# Patient Record
Sex: Male | Born: 1985 | Race: White | Hispanic: No | Marital: Married | Smoking: Current every day smoker
Health system: Southern US, Community
[De-identification: ages and names within clinical notes are randomized; demographics above are authoritative.]

---

## 2017-10-19 ENCOUNTER — Emergency Department: Payer: Medicaid Other

## 2017-10-19 ENCOUNTER — Other Ambulatory Visit: Payer: Self-pay

## 2017-10-19 ENCOUNTER — Encounter: Payer: Self-pay | Admitting: Emergency Medicine

## 2017-10-19 ENCOUNTER — Emergency Department
Admission: EM | Admit: 2017-10-19 | Discharge: 2017-10-19 | Disposition: A | Discharge status: Home / Self Care | Payer: Medicaid Other | Attending: Emergency Medicine | Admitting: Emergency Medicine

## 2017-10-19 DIAGNOSIS — W501XXA Accidental kick by another person, initial encounter: Secondary | ICD-10-CM | POA: Insufficient documentation

## 2017-10-19 DIAGNOSIS — S2231XA Fracture of one rib, right side, initial encounter for closed fracture: Secondary | ICD-10-CM | POA: Diagnosis not present

## 2017-10-19 DIAGNOSIS — Y929 Unspecified place or not applicable: Secondary | ICD-10-CM | POA: Insufficient documentation

## 2017-10-19 DIAGNOSIS — Y9389 Activity, other specified: Secondary | ICD-10-CM | POA: Insufficient documentation

## 2017-10-19 DIAGNOSIS — Y998 Other external cause status: Secondary | ICD-10-CM | POA: Insufficient documentation

## 2017-10-19 DIAGNOSIS — S299XXA Unspecified injury of thorax, initial encounter: Secondary | ICD-10-CM | POA: Diagnosis present

## 2017-10-19 DIAGNOSIS — F17210 Nicotine dependence, cigarettes, uncomplicated: Secondary | ICD-10-CM | POA: Diagnosis not present

## 2017-10-19 MED ORDER — CYCLOBENZAPRINE HCL 10 MG PO TABS: 10.0000 mg | ORAL_TABLET | Freq: Three times a day (TID) | ORAL | 0 refills | Status: AC | PRN

## 2017-10-19 MED ORDER — CYCLOBENZAPRINE HCL 10 MG PO TABS
10.0000 mg | ORAL_TABLET | Freq: Once | ORAL | Status: DC
  Filled 2017-10-19: qty 1

## 2017-10-19 MED ORDER — NAPROXEN 500 MG PO TABS: 500.0000 mg | ORAL_TABLET | Freq: Two times a day (BID) | ORAL | 0 refills | Status: AC

## 2017-10-19 NOTE — ED Provider Notes (Signed)
[facility] Emergency Department Provider Note ____________________________________________  Time seen: Approximately 4:31 PM  I have reviewed the triage vital signs and the nursing notes.   HISTORY  Chief Complaint Rib Injury    HPI Phillip Lawrence is a 32 y.o. male who presents to the emergency department for evaluation and treatment of right sided rib pain.  He was playing with children today and 1 of them accidentally kicked him in the ribs.  He has not experienced any shortness of breath.  Pain in the right rib area increases with movement, deep breath, cough.  No alleviating measures have been attempted for this complaint prior to arrival.  History reviewed. No pertinent past medical history.  There are no active problems to display for this patient.   History reviewed. No pertinent surgical history.  Prior to Admission medications   Medication Sig Start Date End Date Taking? Authorizing Provider  cyclobenzaprine (FLEXERIL) 10 MG tablet Take 1 tablet (10 mg total) by mouth 3 (three) times daily as needed for muscle spasms. 10/19/17   Gabrielly Mccrystal, Rulon Eisenmenger B, FNP  naproxen (NAPROSYN) 500 MG tablet Take 1 tablet (500 mg total) by mouth 2 (two) times daily with a meal. 10/19/17   Daemon Dowty B, FNP    Allergies Patient has no known allergies.  No family history on file.  Social History Social History   Tobacco Use  . Smoking status: Current Every Day Smoker    Types: Cigarettes  . Smokeless tobacco: Never Used  Substance Use Topics  . Alcohol use: Not on file  . Drug use: Not on file    Review of Systems Constitutional: Negative for fever. Cardiovascular: Negative for chest pain. Respiratory: Negative for shortness of breath. Musculoskeletal: Positive for right-sided rib pain Skin: Negative for open wound, lesion, or contusion Neurological: Negative for decrease in sensation  ____________________________________________   PHYSICAL  EXAM:  VITAL SIGNS: ED Triage Vitals [10/19/17 1623]  Enc Vitals Group     BP      Pulse      Resp      Temp      Temp src      SpO2      Weight 220 lb (99.8 kg)     Height 6\' 2"  (1.88 m)     Head Circumference      Peak Flow      Pain Score 7     Pain Loc      Pain Edu?      Excl. in GC?     Constitutional: Alert and oriented. Well appearing and in no acute distress. Eyes: Conjunctivae are clear without discharge or drainage Head: Atraumatic Neck: Supple.  No midline tenderness of the cervical spine on exam. Respiratory: No cough. Respirations are even and unlabored. Musculoskeletal: Right side thoracic tenderness on palpation.  No flail segment is noted. Neurologic: Awake, alert, oriented x4. Skin: Intact Psychiatric: Affect and behavior are appropriate.  ____________________________________________   LABS (all labs ordered are listed, but only abnormal results are displayed)  Labs Reviewed - No data to display ____________________________________________  RADIOLOGY  Right ribs and chest image shows an anterior ninth rib fracture that is nondisplaced per radiology. ____________________________________________   PROCEDURES  Procedures  ____________________________________________   INITIAL IMPRESSION / ASSESSMENT AND PLAN / ED COURSE  Phillip Lawrence is a 32 y.o. who presents to the emergency department for pain in the right lateral thorax.  Image of the right ribs shows a nondisplaced anterior ninth rib fracture.  He will be treated with Naprosyn and Flexeril and encouraged to follow-up with primary care provider of his choice if not improving over the next couple of weeks.  He was given precautions regarding pneumonia and encouraged to make himself take deep breaths often and to use a pillow to cough.  He was encouraged to return to the emergency department immediately if he begins to feel short of breath.  Medications  cyclobenzaprine (FLEXERIL) tablet 10 mg  (10 mg Oral Not Given 10/19/17 1730)    Pertinent labs & imaging results that were available during my care of the patient were reviewed by me and considered in my medical decision making (see chart for details).  _________________________________________   FINAL CLINICAL IMPRESSION(S) / ED DIAGNOSES  Final diagnoses:  Closed fracture of one rib of right side, initial encounter    ED Discharge Orders        Ordered    naproxen (NAPROSYN) 500 MG tablet  2 times daily with meals     10/19/17 1715    cyclobenzaprine (FLEXERIL) 10 MG tablet  3 times daily PRN     10/19/17 1727       If controlled substance prescribed during this visit, 12 month history viewed on the [facility] prior to issuing an initial prescription for Schedule II or III opiod.    Chinita Pesterriplett, Oriah Leinweber B, FNP 10/19/17 [id number]    Phineas SemenGoodman, Graydon, MD 10/19/17 [phone]

## 2017-10-19 NOTE — Discharge Instructions (Signed)
Use a pillow to cough and take deep breaths. Follow up with primary care if not improving over the week. Return to the ER for symptoms of concern if unable to schedule an appointment.

## 2017-10-19 NOTE — ED Triage Notes (Signed)
Injured right ribs today while playing with the kids.  Pain worse with movement and cough.  Patient AAOx3.   Skin warm and dry. NAD

## 2017-10-19 NOTE — ED Notes (Signed)
Pt states he was wrestling with the kids and "I got dropped kicked" in the rib on the right side. Denies any issues with breathing.

## 2019-10-07 IMAGING — CR DG RIBS W/ CHEST 3+V*R*
3 series · 3 of 3 positions shown · non-contrast
Comparison: None.

CLINICAL DATA: Injured right ribs.

EXAM:
RIGHT RIBS AND CHEST - 3+ VIEW

[chest pa]
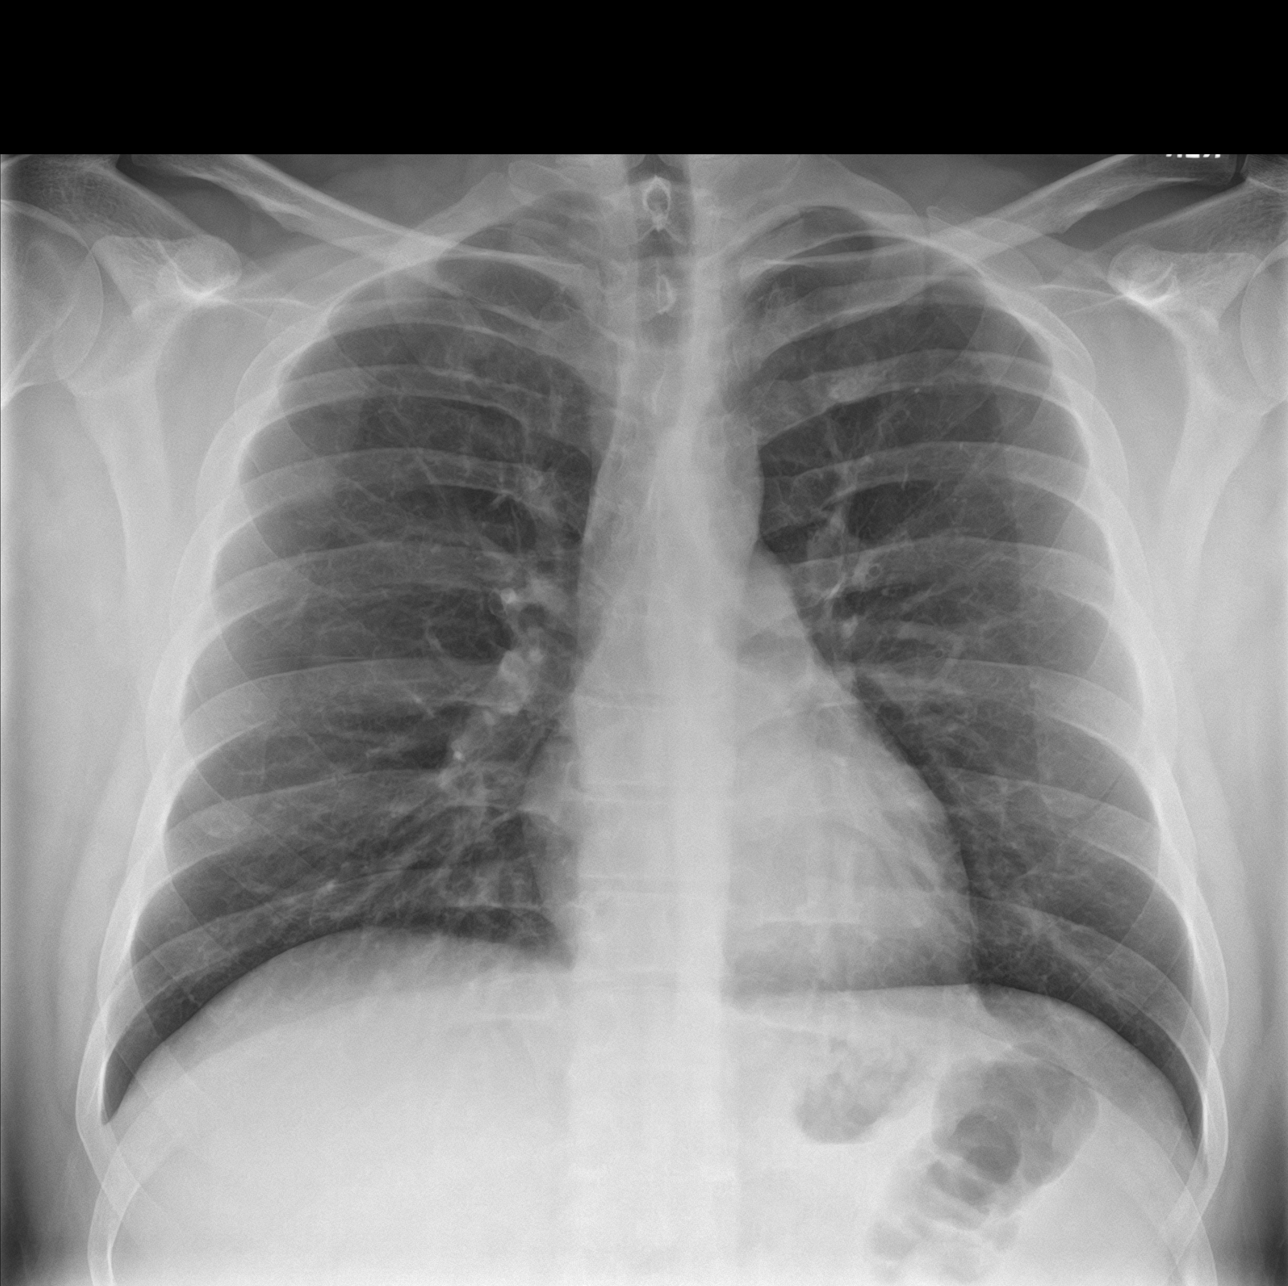

[rib pa]
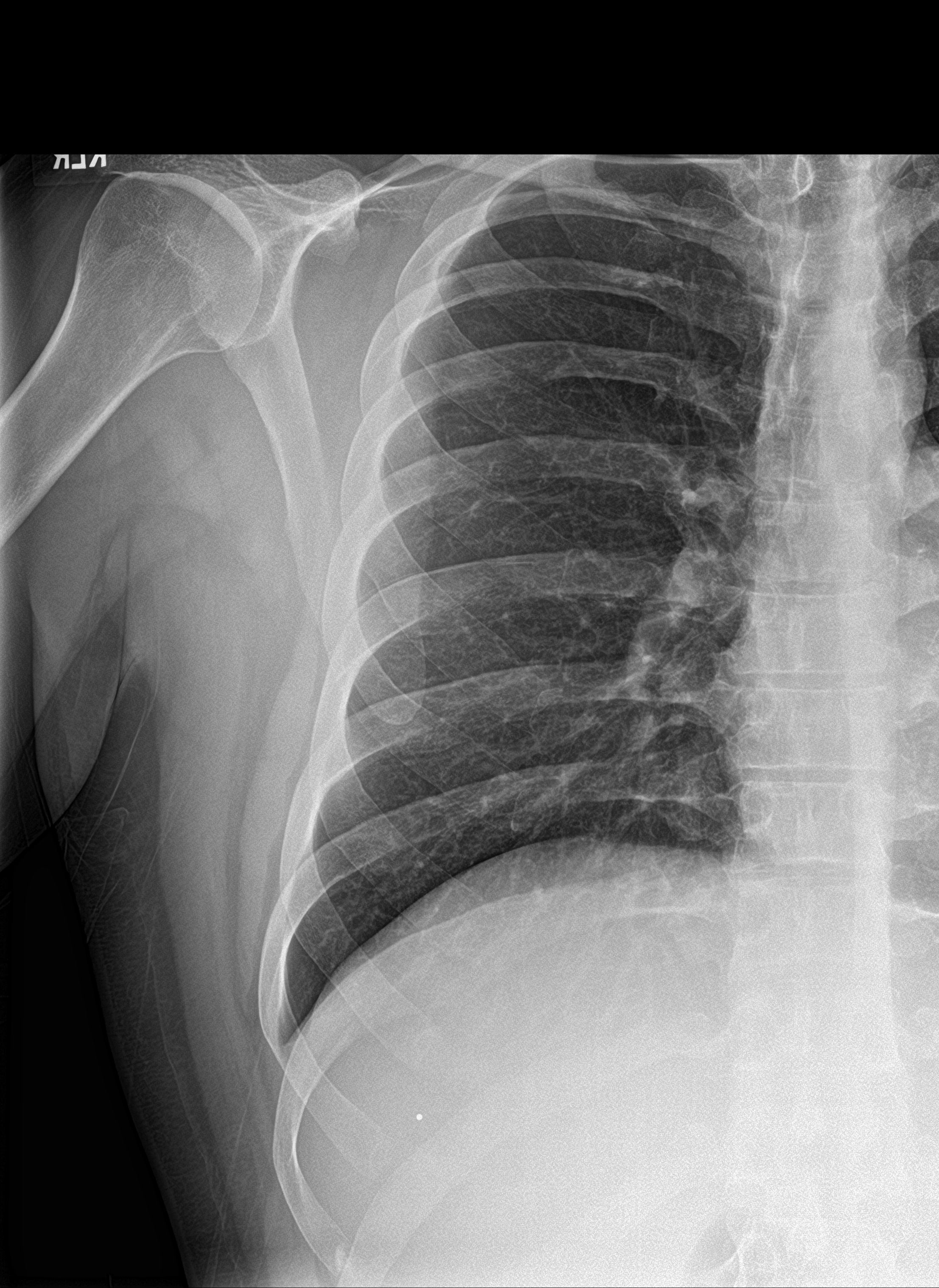

[rib pa obl]
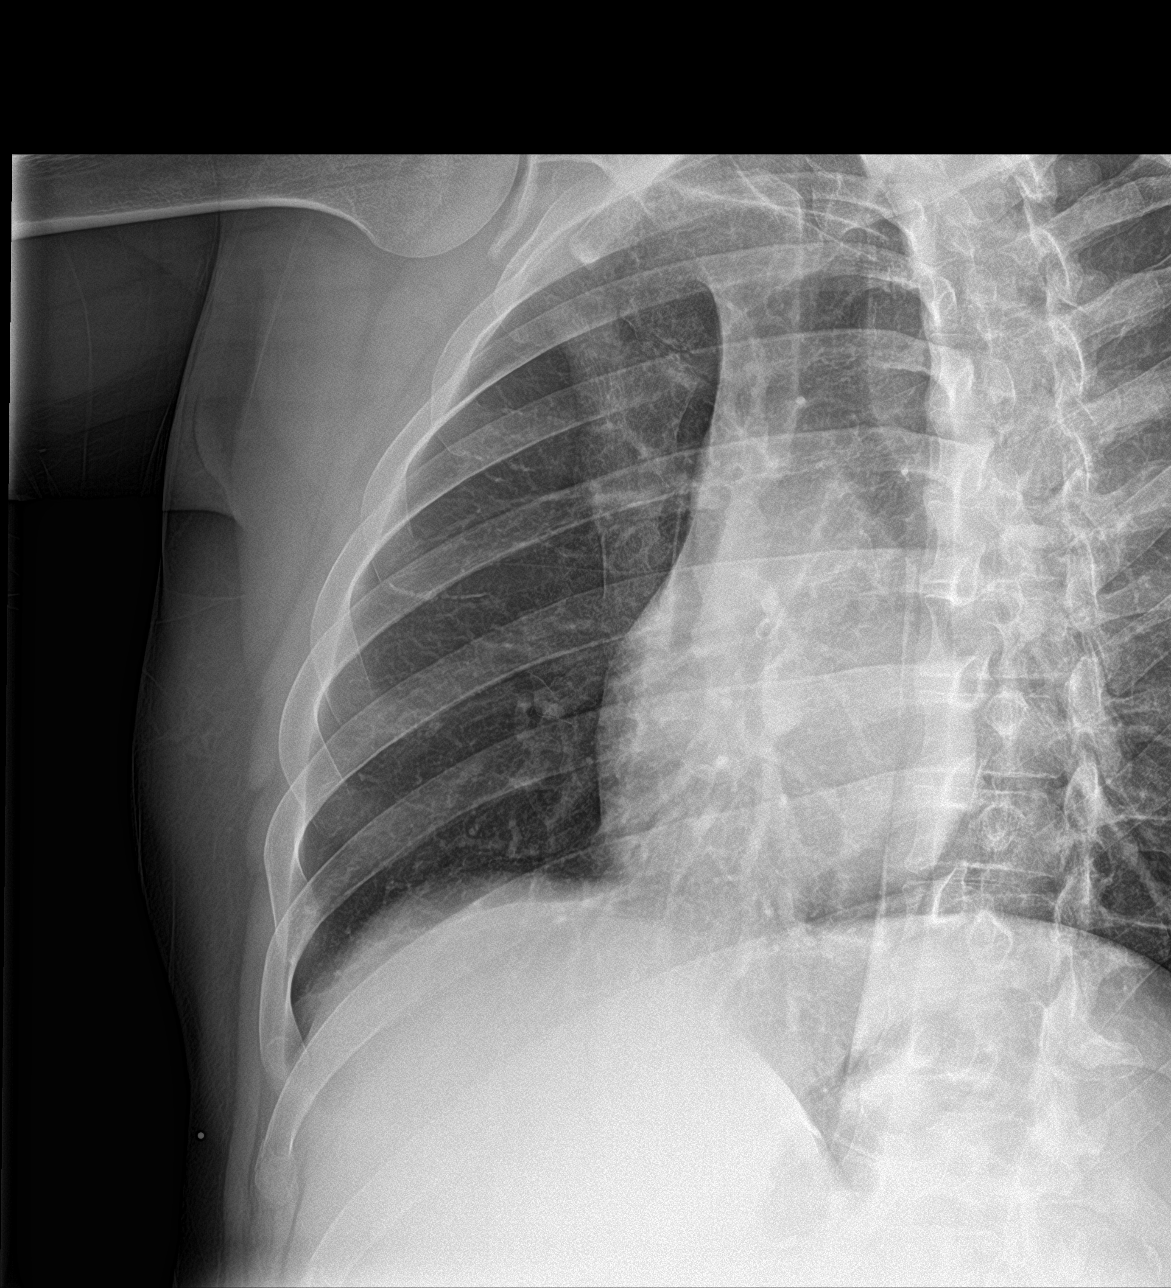

[3 of 3 positions shown; findings below may reference images not displayed]

FINDINGS: Nondisplaced fracture of the right ninth anterior rib. There is no
evidence of pneumothorax or pleural effusion. Both lungs are clear.
Heart size and mediastinal contours are within normal limits.
IMPRESSION: Nondisplaced fracture of the right ninth anterior rib.
